# Patient Record
Sex: Female | Born: 1970 | Race: White | Hispanic: No | Marital: Married | Smoking: Never smoker
Health system: Southern US, Community
[De-identification: ages and names within clinical notes are randomized; demographics above are authoritative.]

## PROBLEM LIST (undated history)

## (undated) DIAGNOSIS — E785 Hyperlipidemia, unspecified: Secondary | ICD-10-CM

## (undated) HISTORY — DX: Hyperlipidemia, unspecified: E78.5

## (undated) HISTORY — PX: ABDOMINOPLASTY: SUR9

## (undated) HISTORY — PX: TONSILLECTOMY: SUR1361

---

## 2018-09-21 ENCOUNTER — Other Ambulatory Visit: Payer: Self-pay | Admitting: Obstetrics and Gynecology

## 2018-09-21 DIAGNOSIS — R928 Other abnormal and inconclusive findings on diagnostic imaging of breast: Secondary | ICD-10-CM

## 2018-09-28 ENCOUNTER — Ambulatory Visit
Admission: RE | Admit: 2018-09-28 | Discharge: 2018-09-28 | Disposition: A | Discharge status: Home / Self Care | Payer: BLUE CROSS/BLUE SHIELD | Source: Ambulatory Visit | Attending: Obstetrics and Gynecology | Admitting: Obstetrics and Gynecology

## 2018-09-28 ENCOUNTER — Ambulatory Visit: Payer: Self-pay

## 2018-09-28 DIAGNOSIS — R928 Other abnormal and inconclusive findings on diagnostic imaging of breast: Secondary | ICD-10-CM

## 2019-07-10 IMAGING — MG DIGITAL DIAGNOSTIC UNILATERAL LEFT MAMMOGRAM WITH TOMO AND CAD
4 series · 4 of 12 positions shown · non-contrast
Comparison: Previous exam(s).

CLINICAL DATA: Callback from screening mammogram for possible
architectural distortion left breast.

EXAM:
DIGITAL DIAGNOSTIC UNILATERAL LEFT MAMMOGRAM WITH CAD AND TOMO

[L CC synth-2D]
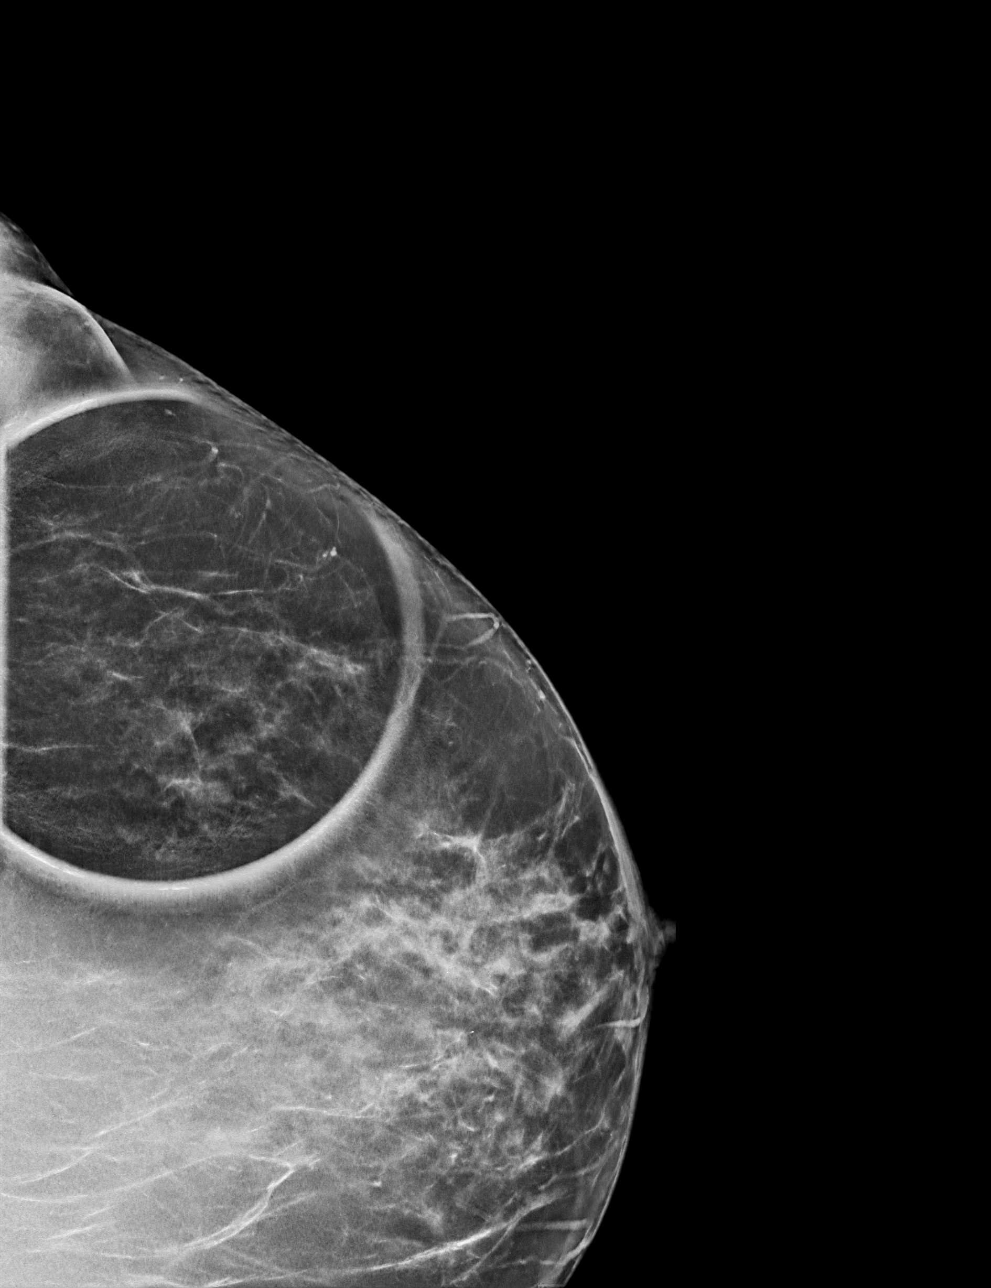

[L ML synth-2D]
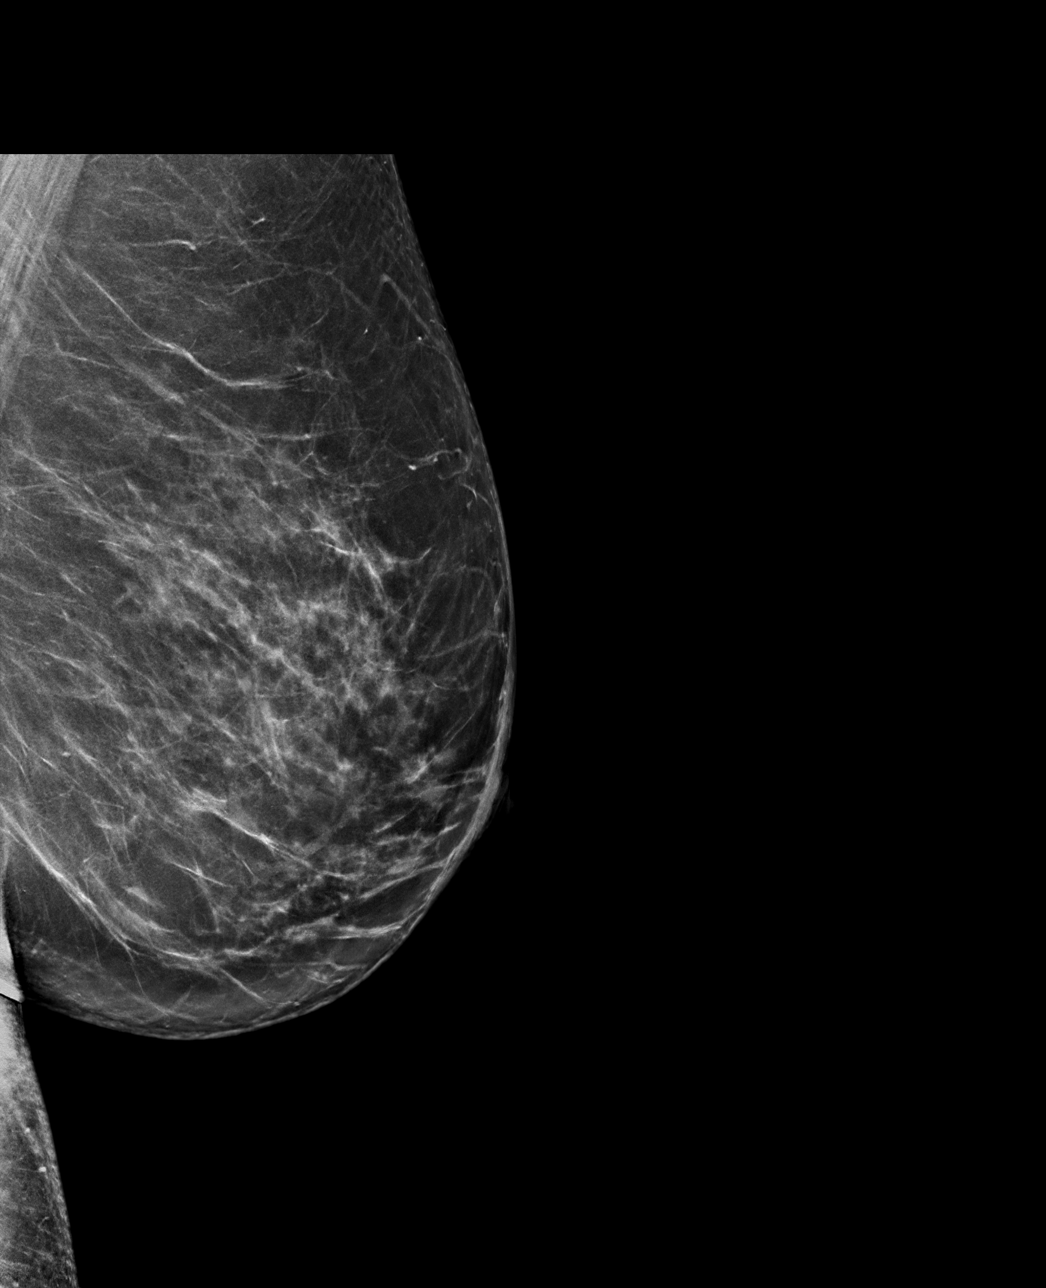

[L CC tomo · tomo slice 35/69.0]
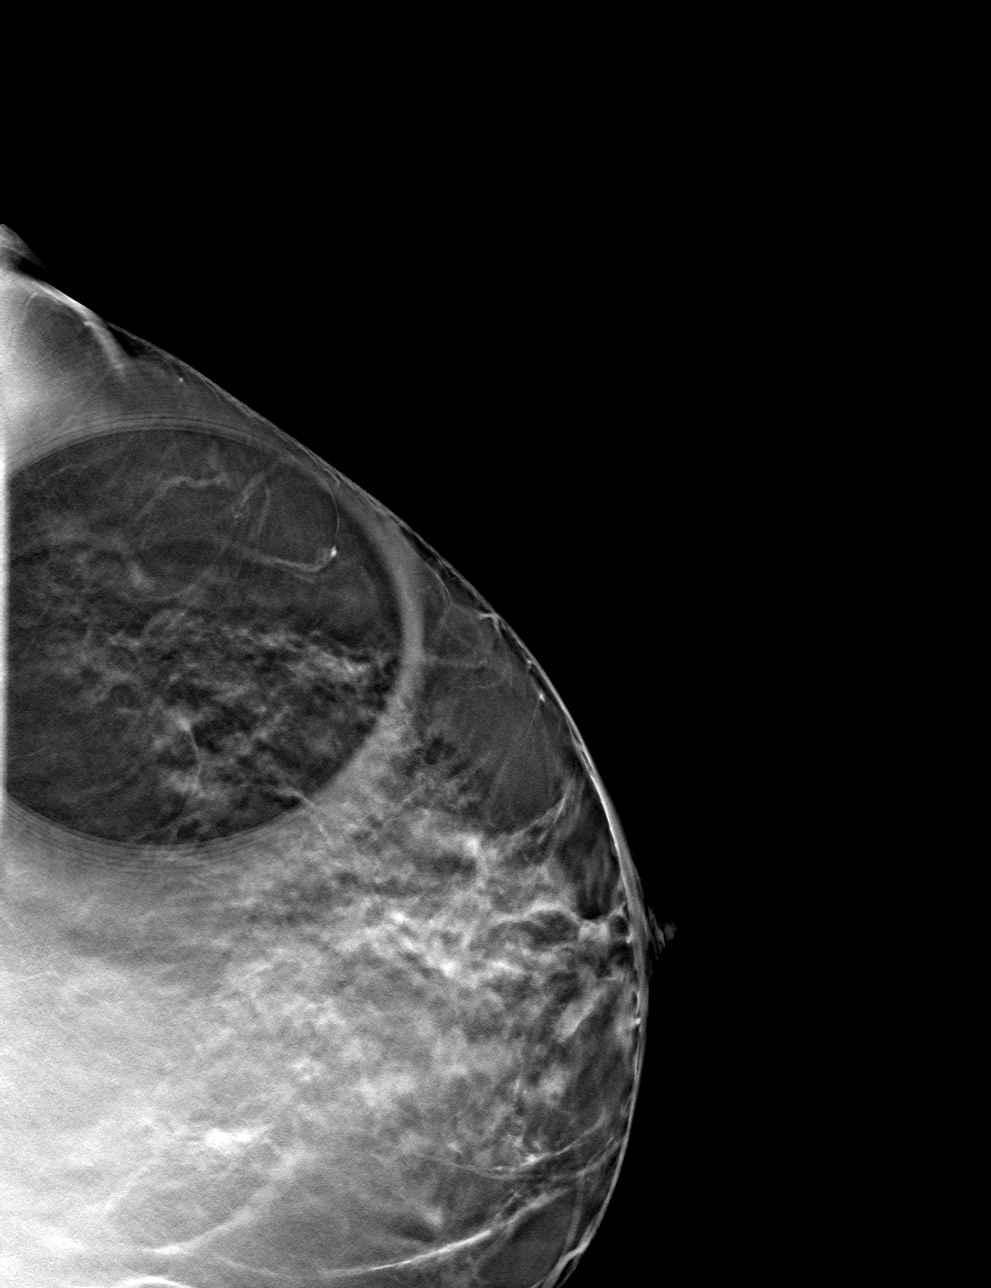

[L ML tomo · tomo slice 43/84.0]
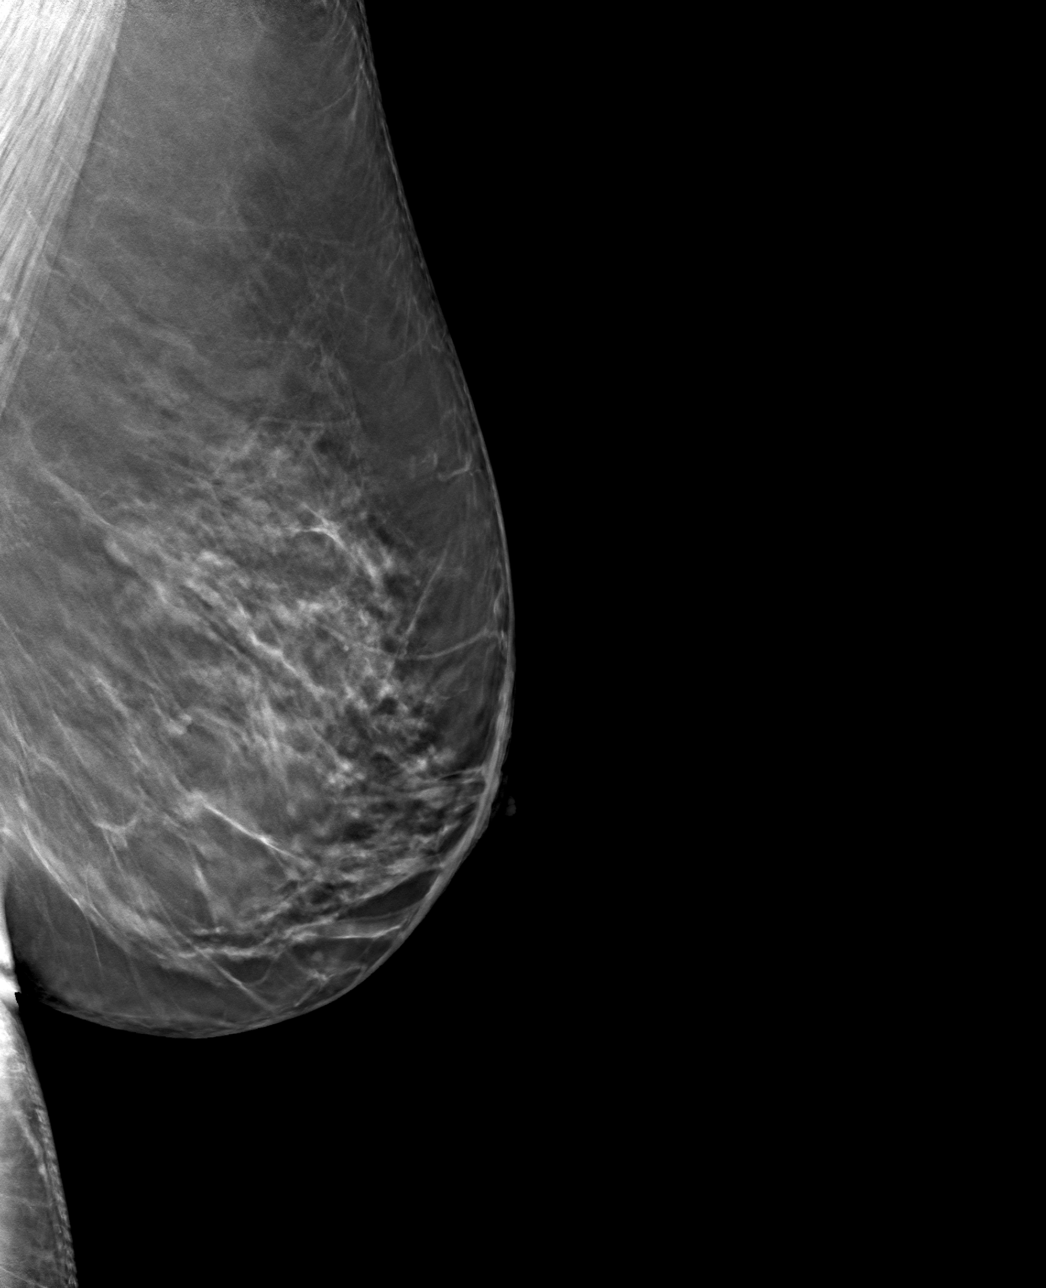

[4 of 12 positions shown; findings below may reference images not displayed]

ACR Breast Density Category b: There are scattered areas of
fibroglandular density.
FINDINGS: Lateral view of left breast, spot compression left CC view are
submitted. Previously questioned architectural distortion does not
persist on additional views. No suspicious abnormalities identified.

Mammographic images were processed with CAD.
IMPRESSION: Benign findings.

RECOMMENDATION:
Routine screening mammogram back on schedule.

I have discussed the findings and recommendations with the patient.
Results were also provided in writing at the conclusion of the
visit. If applicable, a reminder letter will be sent to the patient
regarding the next appointment.

BI-RADS CATEGORY  2: Benign.

## 2022-02-26 ENCOUNTER — Encounter: Payer: Self-pay | Admitting: Cardiology

## 2022-02-26 NOTE — Telephone Encounter (Signed)
Labs 02/13/2022:  Total cholesterol 231, triglycerides 99, HDL 58, LDL 156.  Iron saturation and vitamin B12 and folate normal.  Hb 13.8/HCT 40.1, platelets 241, normal indicis.  Serum glucose 89 mg, BUN 17, creatinine 0.94, EGFR 73 mL, potassium 4.1, LFTs normal.  TSH normal at 1.930.

## 2022-02-26 NOTE — Telephone Encounter (Signed)
From patient.

## 2022-03-04 NOTE — Telephone Encounter (Signed)
From pt

## 2022-03-05 NOTE — Telephone Encounter (Signed)
From pt

## 2022-03-20 ENCOUNTER — Encounter: Payer: Self-pay | Admitting: Cardiology

## 2022-03-20 ENCOUNTER — Ambulatory Visit: Payer: BC Managed Care – PPO | Admitting: Cardiology

## 2022-03-20 VITALS — BP 123/74 | HR 68 | Temp 97.3°F | Resp 16 | Ht 64.0 in | Wt 191.0 lb

## 2022-03-20 DIAGNOSIS — E7800 Pure hypercholesterolemia, unspecified: Secondary | ICD-10-CM

## 2022-03-20 DIAGNOSIS — R000 Tachycardia, unspecified: Secondary | ICD-10-CM

## 2022-03-20 NOTE — Progress Notes (Signed)
Primary Physician/Referring:  Doyle Askew, PA-C  Patient ID: Anna Schmidt, female    DOB: 12/26/1970, 51 y.o.   MRN: [id number]  Chief Complaint  Patient presents with   Irregular Heart Beat   New Patient (Initial Visit)   HPI:    Anna Schmidt  is a 51 y.o. Female patient with evaluated in 2021 for elevated heart rate and was seen by cardiology Dr. Fay Records, outpatient extended EKG monitoring it revealed occasional PVCs and brief episodes of ventricular bigeminy.  She had been doing well until about 3 to 4 months ago started noticing rapid increase in heart rate and sustained heart rate elevation around 190 bpm when she plays pickle ball.  No other associated symptoms. Also the heart rate comes down very slowly and gradually.   Past Medical History:  Diagnosis Date   Hyperlipidemia    Past Surgical History:  Procedure Laterality Date   ABDOMINOPLASTY     CESAREAN SECTION     TONSILLECTOMY     Family History  Problem Relation Age of Onset   Hyperlipidemia Mother    Heart disease Father 89       CABG   Hyperlipidemia Father     Social History   Tobacco Use   Smoking status: Never   Smokeless tobacco: Never  Substance Use Topics   Alcohol use: Yes    Comment: Occ   Marital Status: Married  ROS  Review of Systems  Cardiovascular:  Positive for palpitations. Negative for chest pain, dyspnea on exertion and leg swelling.   Objective      03/20/2022    9:26 AM  Vitals with BMI  Height 5' 4"  Weight 191 lbs  BMI 44.03  Systolic 474  Diastolic 74  Pulse 68   Today's Vitals   03/20/22 0926  BP: 123/74  Pulse: 68  Resp: 16  Temp: (!) 97.3 F (36.3 C)  TempSrc: Temporal  SpO2: 92%  Weight: 191 lb (86.6 kg)  Height: 5' 4" (1.626 m)   Body mass index is 32.79 kg/m.  Physical Exam Constitutional:      Appearance: She is obese.  Neck:     Vascular: No carotid bruit or JVD.  Cardiovascular:     Rate and  Rhythm: Normal rate and regular rhythm.     Pulses: Intact distal pulses.     Heart sounds: Normal heart sounds. No murmur heard.    No gallop.  Pulmonary:     Effort: Pulmonary effort is normal.     Breath sounds: Normal breath sounds.  Abdominal:     General: Bowel sounds are normal.     Palpations: Abdomen is soft.  Musculoskeletal:     Right lower leg: No edema.     Left lower leg: No edema.    Medications and allergies  No Known Allergies   Medication list after today's encounter   Current Outpatient Medications:    albuterol (VENTOLIN HFA) 108 (90 Base) MCG/ACT inhaler, 1-2 puffs as needed., Disp: , Rfl:    Cholecalciferol (VITAMIN D3) 75 MCG (3000 UT) TABS, Take 1 tablet by mouth daily in the afternoon., Disp: , Rfl:    cyanocobalamin (VITAMIN B12) 500 MCG tablet, Take 500 mcg by mouth daily as needed., Disp: , Rfl:    meloxicam (MOBIC) 15 MG tablet, Take 15 mg by mouth daily., Disp: , Rfl:    montelukast (SINGULAIR) 10 MG tablet, Take 1 tablet by mouth daily as needed., Disp: , Rfl:  Multiple Vitamin (MULTI-VITAMIN) tablet, Take 1 tablet by mouth daily in the afternoon., Disp: , Rfl:    Multiple Vitamins-Minerals (ZINC PO), Take 1 tablet by mouth daily at 12 noon., Disp: , Rfl:    rosuvastatin (CRESTOR) 40 MG tablet, Take 40 mg by mouth daily., Disp: , Rfl:   Laboratory examination:   External labs:   Labs 02/06/2022:  Iron studies and B12 and folate normal.  Hb 13.8/HCT 40.1, platelets 221, normal indicis.  Serum glucose 89 mg, BUN 17, creatinine 0.94, EGFR 73 mill, potassium 4.1, LFTs normal.  TSH normal at 1.26.  Total cholesterol 231, triglycerides 99, HDL 58, LDL 156.  Labs 11/04/2017:  Total cholesterol 286, triglycerides 169, HDL 53, LDL 201.  Radiology:    Cardiac Studies:   Echocardiogram 04/12/2020: There is normal left ventricular wall thickness. Left ventricular systolic function is normal. LV ejection fraction = 60-65%. The left ventricular  wall motion is normal. Left ventricular filling pattern is normal. The right ventricle is normal in size and function. There is no significant valvular stenosis or regurgitation. IVC size was mildly dilated. There is no pericardial effusion.  Stress echocardiogram 04/06/2020: Patient achieved 9.0 METS.  Exercise capacity was average.  No significant arrhythmias.  No chest pain. Echocardiogram was normal at rest and no wall motion abnormality with stress.  Normal stress echocardiogram.  Extended EKG monitoring 10 days starting 04/12/2020: Minimum heart rate 47, maximum heart rate 168 beats minute, sinus rhythm.  There were isolated SVE and isolated ventricular ectopics, 1 brief episodes of ventricular bigeminy.  No symptoms were reported.  EKG:    EKG 03/20/2022: Normal sinus rhythm at rate of 73 bpm, cannot exclude inferior infarct old.  No evidence of ischemia, normal QT interval.   EKG 03/20/2022, repeated at 1036 AM with breath held in deep inspiration: Normal sinus rhythm at rate of 62 bpm.  Normal EKG.  Assessment     ICD-10-CM   1. Rapid heart rate  R00.0 EKG 12-Lead    EKG 12-Lead    2. Pure hypercholesterolemia  E78.00 CT CARDIAC SCORING ([facility] LOCATIONS ONLY)       Orders Placed This Encounter  Procedures   CT CARDIAC SCORING ([facility] LOCATIONS ONLY)    Standing Status:   Future    Standing Expiration Date:   05/20/2022    Order Specific Question:   Is patient pregnant?    Answer:   No    Order Specific Question:   Preferred imaging location?    Answer:   [facility]   EKG 12-Lead   EKG 12-Lead    No orders of the defined types were placed in this encounter.   There are no discontinued medications.   Recommendations:   Anna Schmidt is a 51 y.o.  Female patient with evaluated in 2021 for elevated heart rate and was seen by cardiology Dr. Fay Records, outpatient extended EKG monitoring it revealed occasional PVCs and brief episodes of ventricular  bigeminy.  She had been doing well until about 3 to 4 months ago started noticing rapid increase in heart rate and sustained heart rate elevation around 190 bpm when she plays pickle ball.  No other associated symptoms. Also the heart rate comes down very slowly and gradually.  I will emperically try Vit B1 (Thiamine) 50 mg, Vit B6 (Pyrodoxine) 50 mg and Vit B12 (rappid release) 1070mg, twice daily for palpitations and tachycardia. Consider Fish oil capsule daily with food.  Concerning symptoms are marked elevation in LDL, baseline LDL  was >200.  She is still not at goal and she did not tolerate pravastatin and now has been switched over to Crestor 40 mg daily that was started a month ago.  She still has mild muscle fatigue with this especially at night, depending upon LDL response we could certainly consider decreasing the dose to 20 mg with addition of Zetia.  If vitamin D has not been checked it would be appropriate to do so.  For now I have recommended a coronary calcium score and then determine what kind of further testing she needs.  In view of markedly elevated LDL, heart rate elevation may be a subclinical sign for underlying occult CAD.  She does not have any family history of premature coronary disease.  She is not a diabetic and does not smoke.  I would like to see her back in 4 to 6 weeks for follow-up.  I had to repeat her EKG as initial EKG was abnormal with Q waves in 3 and aVF, repeat EKG with breath-hold and deep inspiration revealing normal EKG.  I also reviewed her external investigations performed 2 years ago with regard to cardiac status.   Adrian Prows, MD, [facility] 03/20/2022, 10:45 AM Office: [phone]

## 2022-04-16 ENCOUNTER — Ambulatory Visit
Admission: RE | Admit: 2022-04-16 | Discharge: 2022-04-16 | Disposition: A | Discharge status: Home / Self Care | Payer: No Typology Code available for payment source | Source: Ambulatory Visit | Attending: Cardiology | Admitting: Cardiology

## 2022-04-16 DIAGNOSIS — E7800 Pure hypercholesterolemia, unspecified: Secondary | ICD-10-CM

## 2022-04-17 ENCOUNTER — Encounter: Payer: Self-pay | Admitting: Cardiology

## 2022-04-17 NOTE — Progress Notes (Signed)
Coronary calcium score 04/17/2022: Coronary calcium score of 0.  Normal ascending and descending thoracic aortic measurements.  No significant extracardiac abnormality.

## 2022-04-17 NOTE — Telephone Encounter (Signed)
From patient.

## 2022-05-02 ENCOUNTER — Ambulatory Visit: Payer: BC Managed Care – PPO | Admitting: Cardiology

## 2022-05-02 ENCOUNTER — Encounter: Payer: Self-pay | Admitting: Cardiology

## 2022-05-02 VITALS — BP 107/64 | HR 82 | Temp 98.1°F | Resp 16 | Ht 64.0 in | Wt 187.8 lb

## 2022-05-02 DIAGNOSIS — R55 Syncope and collapse: Secondary | ICD-10-CM

## 2022-05-02 DIAGNOSIS — E7800 Pure hypercholesterolemia, unspecified: Secondary | ICD-10-CM

## 2022-05-02 NOTE — Progress Notes (Signed)
Primary Physician/Referring:  Doyle Askew, PA-C  Patient ID: Anna Schmidt, female    DOB: 1971/04/27, 51 y.o.   MRN: [id number]  Chief Complaint  Patient presents with   Follow-up   Palpitations   Hyperlipidemia   HPI:    Anna Schmidt  is a 51 y.o. Female patient with evaluated in 2021 for elevated heart rate and was seen by cardiology Dr. Fay Records, outpatient extended EKG monitoring it revealed occasional PVCs and brief episodes of ventricular bigeminy.  At last visit, she presented with rapid increase in heart rate and sustained heart rate elevation around 190 bpm while playing pickle ball without other associated symptoms. She has not had recurrence of tachycardia. She did have an episode of syncope. She was sitting in a booth during dinner and recalls feeling nausea. Per her husband she said "I feel like I am going to pass out". She does not recall any other events around this episode. She contributed this to possible low blood sugar or dehydration. She has not had another syncopal or pre-syncopal event.   Overall, she is doing well. She denies chest pain, shortness of breath, palpitations.  Past Medical History:  Diagnosis Date   Hyperlipidemia    Past Surgical History:  Procedure Laterality Date   ABDOMINOPLASTY     CESAREAN SECTION     TONSILLECTOMY     Family History  Problem Relation Age of Onset   Hyperlipidemia Mother    Heart disease Father 67       CABG   Hyperlipidemia Father     Social History   Tobacco Use   Smoking status: Never   Smokeless tobacco: Never  Substance Use Topics   Alcohol use: Yes    Comment: Occ   Marital Status: Married  ROS  Review of Systems  Constitutional: Negative for malaise/fatigue.  Cardiovascular:  Negative for chest pain, dyspnea on exertion, leg swelling and palpitations.  Respiratory:  Negative for shortness of breath.    Objective      05/02/2022   10:30 AM 03/20/2022     9:26 AM  Vitals with BMI  Height _0  _1   Weight 187 lbs 13 oz 191 lbs  BMI 32.12 24.82  Systolic 500 370  Diastolic 64 74  Pulse 82 68   Today's Vitals   05/02/22 1030  BP: 107/64  Pulse: 82  Resp: 16  Temp: 98.1 F (36.7 C)  TempSrc: Temporal  SpO2: 97%  Weight: 187 lb 12.8 oz (85.2 kg)  Height: _2  (1.626 m)    Body mass index is 32.24 kg/m.  Physical Exam Neck:     Vascular: No carotid bruit or JVD.  Cardiovascular:     Rate and Rhythm: Normal rate and regular rhythm.     Pulses: Intact distal pulses.     Heart sounds: Normal heart sounds. No murmur heard.    No gallop.  Pulmonary:     Effort: Pulmonary effort is normal.     Breath sounds: Normal breath sounds.  Abdominal:     General: Bowel sounds are normal.     Palpations: Abdomen is soft.  Musculoskeletal:     Right lower leg: No edema.     Left lower leg: No edema.  Neurological:     Mental Status: She is alert.    Medications and allergies  No Known Allergies   Medication list after today's encounter   Current Outpatient Medications:    Cholecalciferol (VITAMIN D3) 75 MCG (  3000 UT) TABS, Take 1 tablet by mouth daily in the afternoon., Disp: , Rfl:    cyanocobalamin (VITAMIN B12) 500 MCG tablet, Take 500 mcg by mouth daily as needed., Disp: , Rfl:    Multiple Vitamin (MULTI-VITAMIN) tablet, Take 1 tablet by mouth daily in the afternoon., Disp: , Rfl:    Multiple Vitamins-Minerals (ZINC PO), Take 1 tablet by mouth daily at 12 noon., Disp: , Rfl:    rosuvastatin (CRESTOR) 40 MG tablet, Take 40 mg by mouth daily., Disp: , Rfl:   Laboratory examination:   External labs:   Labs 02/06/2022:  Iron studies and B12 and folate normal.  Hb 13.8/HCT 40.1, platelets 221, normal indicis.  Serum glucose 89 mg, BUN 17, creatinine 0.94, EGFR 73 mill, potassium 4.1, LFTs normal.  TSH normal at 1.26.  Total cholesterol 231, triglycerides 99, HDL 58, LDL 156.  Radiology:    Cardiac Studies:    CT Cardiac Scoring 04/16/22: No visible coronary artery calcifications. Total coronary calcium score of 0.  Echocardiogram 04/12/2020: There is normal left ventricular wall thickness. Left ventricular systolic function is normal. LV ejection fraction = 60-65%. The left ventricular wall motion is normal. Left ventricular filling pattern is normal. The right ventricle is normal in size and function. There is no significant valvular stenosis or regurgitation. IVC size was mildly dilated. There is no pericardial effusion.  Stress echocardiogram 04/06/2020: Patient achieved 9.0 METS.  Exercise capacity was average.  No significant arrhythmias.  No chest pain. Echocardiogram was normal at rest and no wall motion abnormality with stress.  Normal stress echocardiogram.  Extended EKG monitoring 10 days starting 04/12/2020: Minimum heart rate 47, maximum heart rate 168 beats minute, sinus rhythm.  There were isolated SVE and isolated ventricular ectopics, 1 brief episodes of ventricular bigeminy.  No symptoms were reported.  EKG:   EKG 03/20/2022: Normal sinus rhythm at rate of 73 bpm, cannot exclude inferior infarct old.  No evidence of ischemia, normal QT interval.  EKG 03/20/2022, repeated at 1036 AM with breath held in deep inspiration: Normal sinus rhythm at rate of 62 bpm.  Normal EKG.  Assessment     ICD-10-CM   1. Vasovagal syncope  R55     2. Pure hypercholesterolemia  E78.00 Lipid Panel With LDL/HDL Ratio    LDL cholesterol, direct       Orders Placed This Encounter  Procedures   Lipid Panel With LDL/HDL Ratio   LDL cholesterol, direct    No orders of the defined types were placed in this encounter.   Medications Discontinued During This Encounter  Medication Reason   albuterol (VENTOLIN HFA) 108 (90 Base) MCG/ACT inhaler    meloxicam (MOBIC) 15 MG tablet    montelukast (SINGULAIR) 10 MG tablet      Recommendations:   Anna Schmidt is a 51 y.o.  Female  patient with evaluated in 2021 for elevated heart rate and was seen by cardiology Dr. Fay Records, outpatient extended EKG monitoring it revealed occasional PVCs and brief episodes of ventricular bigeminy.  At last visit, she presented with rapid increase in heart rate and sustained heart rate elevation around 190 bpm while playing pickle ball without other associated symptoms. She has not had recurrence of tachycardia. She did have an episode of syncope. She was sitting in a booth during dinner and recalls feeling nausea. Per her husband she said "I feel like I am going to pass out". She does not recall any other events around this episode. She contributed  this to possible low blood sugar or dehydration. She has not had another syncopal or pre-syncopal event.   Suspect that syncopal event was due to vasovagal response. We discussed valsalva maneuver if she experiences pre-syncope symptoms. Advised pre-hydration with Pedialyte prior to exercise and play pickle ball.  Given low risk factors and no significant family history of cardiac disease I am ok with her LDL level being at or around 100. She is currently taking Crestor 43m daily. Will re-check lipid panel today. Discussed weight loss and the impact of weight loss on LDL.  She is otherwise stable from a cardiac standpoint. We will see her back on as needed basis.   BErnst Spell NP 05/02/2022, 11:07 AM Office: [phone]

## 2022-05-03 LAB — LDL CHOLESTEROL, DIRECT: LDL Direct: 109 mg/dL — ABNORMAL HIGH (ref 0–99)

## 2022-05-03 LAB — LIPID PANEL WITH LDL/HDL RATIO
Cholesterol, Total: 184 mg/dL (ref 100–199)
HDL: 53 mg/dL (ref >39)
LDL Chol Calc (NIH): 114 mg/dL — ABNORMAL HIGH (ref 0–99)
LDL/HDL Ratio: 2.2 ratio (ref 0.0–3.2)
Triglycerides: 91 mg/dL (ref 0–149)
VLDL Cholesterol Cal: 17 mg/dL (ref 5–40)

## 2022-06-20 ENCOUNTER — Ambulatory Visit: Payer: BC Managed Care – PPO | Admitting: Cardiology
